# Patient Record
Sex: Female | Born: 1990 | Race: White | Hispanic: No | Marital: Single | Smoking: Current some day smoker
Health system: Southern US, Community
[De-identification: ages and names within clinical notes are randomized; demographics above are authoritative.]

## PROBLEM LIST (undated history)

## (undated) DIAGNOSIS — F419 Anxiety disorder, unspecified: Secondary | ICD-10-CM

## (undated) DIAGNOSIS — E781 Pure hyperglyceridemia: Secondary | ICD-10-CM

## (undated) DIAGNOSIS — F909 Attention-deficit hyperactivity disorder, unspecified type: Secondary | ICD-10-CM

## (undated) HISTORY — DX: Pure hyperglyceridemia: E78.1

## (undated) HISTORY — DX: Attention-deficit hyperactivity disorder, unspecified type: F90.9

## (undated) HISTORY — DX: Anxiety disorder, unspecified: F41.9

---
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
## 2010-04-16 ENCOUNTER — Emergency Department (HOSPITAL_COMMUNITY): Admission: EM | Admit: 2010-04-16 | Discharge: 2010-04-16 | Payer: Self-pay | Admitting: Emergency Medicine

## 2010-08-21 LAB — URINE MICROSCOPIC-ADD ON

## 2010-08-21 LAB — URINALYSIS, ROUTINE W REFLEX MICROSCOPIC
Glucose, UA: NEGATIVE mg/dL
Leukocytes, UA: NEGATIVE
Protein, ur: NEGATIVE mg/dL
pH: 5.5 (ref 5.0–8.0)

## 2010-08-21 LAB — GC/CHLAMYDIA PROBE AMP, GENITAL
Chlamydia, DNA Probe: NEGATIVE
GC Probe Amp, Genital: NEGATIVE

## 2010-08-21 LAB — WET PREP, GENITAL: Trich, Wet Prep: NONE SEEN

## 2010-08-21 LAB — POCT PREGNANCY, URINE: Preg Test, Ur: NEGATIVE

## 2011-05-19 ENCOUNTER — Ambulatory Visit (INDEPENDENT_AMBULATORY_CARE_PROVIDER_SITE_OTHER): Payer: BC Managed Care – PPO

## 2011-05-19 DIAGNOSIS — J029 Acute pharyngitis, unspecified: Secondary | ICD-10-CM

## 2011-07-12 ENCOUNTER — Ambulatory Visit (INDEPENDENT_AMBULATORY_CARE_PROVIDER_SITE_OTHER): Payer: BC Managed Care – PPO | Admitting: Physician Assistant

## 2011-07-12 VITALS — BP 124/78 | HR 80 | Temp 98.1°F | Resp 16 | Ht 66.5 in | Wt 204.0 lb

## 2011-07-12 DIAGNOSIS — F411 Generalized anxiety disorder: Secondary | ICD-10-CM

## 2011-07-12 DIAGNOSIS — F988 Other specified behavioral and emotional disorders with onset usually occurring in childhood and adolescence: Secondary | ICD-10-CM

## 2011-07-12 MED ORDER — ALPRAZOLAM 0.5 MG PO TABS: 0.5000 mg | ORAL_TABLET | Freq: Three times a day (TID) | ORAL | Status: AC | PRN

## 2011-07-12 MED ORDER — METHYLPHENIDATE HCL 20 MG PO TABS: 20.0000 mg | ORAL_TABLET | Freq: Two times a day (BID) | ORAL | Status: DC

## 2011-07-12 NOTE — Patient Instructions (Signed)
Continue follow-up with Lonell Face.  Take the Ritalin twice daily, though it's ok to take 1/2 tablet, and only take it once daily if you don't need a second dose.  Use the Alprazolam as needed for panic attacks.

## 2011-07-12 NOTE — Progress Notes (Signed)
  Subjective:    Patient ID: Susan [name], female    DOB: [birth year], 21 y.o.   MRN: [id number]  HPI Patient presents for reinitiation of treatment for generalized anxiety disorder and attention deficit disorder. She has previously done very well on Ritalin. She has been out of therapy and off medication for several years now. Mostly out of resistance to her parents desires space for her treatment. Both parents are psychologists. They insisted on treatment throughout her childhood and required that she see therapists that practiced their same kind of treatment-Freudian. She found treatment and very unhelpful. SSRIs increased her anxiety symptoms and exacerbated suicidal ideations. She came to [place] from [place] to attend college. She is engaged. She is taking a semester off from school with the intent to get herself "back together again."  She reports "thought loops." in the past when she was placed on Ritalin LA 40 mg she experienced "my mind suddenly cleared." She believes the LA component caused her to have mood swings but it was used because she would forget to take the second dose of the short acting product. Additionally she's used clonazepam 0.5 mg tablets. He affect was helpful but to delayed to treat the actual panic attack.  She is extremely concerned about a product that would cause weight gain as she has had a 50 pound weight gain since discontinuing Effexor last year.  Her fianc is present with her today. He also has attention difficulties.  The patient is currently in psychotherapy with Lonell Face, she is tolerating it well as he is a therapist of her choosing rather than her parents. She is not experiencing any suicidal or homicidal ideations. She is extremely scattered and is having difficulty getting much of anything down. That contributes to her anxiety symptoms and drives the cycle.   Review of Systems  Psychiatric/Behavioral: Positive for decreased concentration.  Negative for suicidal ideas, hallucinations, behavioral problems, confusion, sleep disturbance, self-injury, dysphoric mood and agitation. The patient is nervous/anxious. The patient is not hyperactive.        Objective:   Physical Exam  Psychiatric: Her speech is normal and behavior is normal. Judgment normal. Her mood appears anxious. Her affect is labile. Her affect is not angry, not blunt and not inappropriate. Thought content is not paranoid and not delusional. Cognition and memory are normal. She does not exhibit a depressed mood. She expresses no homicidal and no suicidal ideation. She expresses no suicidal plans and no homicidal plans.          Assessment & Plan:  1. Attention deficit disorder. Ritalin 20 mg 1 by mouth twice a day #60 no refills. Patient is advised that she may take one half tablet and doesn't have to take the second dose each day if it is unneeded. Reevaluate in 4 weeks. The patient intends to research the Daytrana patch in the interim.  2. Generalized anxiety disorder. Obviously exacerbated by untreated attention deficit. Anticipate improvement in overall symptoms and reduction of frequency of panic attacks with adequate treatment of hypertension. In the interim we will try alprazolam 0.5 mg 3 times a day when necessary panic. #90, no refills.

## 2011-07-25 ENCOUNTER — Telehealth: Payer: Self-pay

## 2011-07-25 NOTE — Telephone Encounter (Signed)
CHELLE   PLEASE CALL ERIC NELSON REGARDING MUTUAL PATIENT  [phone]  CELL PHONE

## 2011-07-30 ENCOUNTER — Telehealth: Payer: Self-pay

## 2011-07-30 NOTE — Telephone Encounter (Signed)
.  [facility]    ERIC NELSON REQUESTING TO SPEAK TO [name]    BEST PHONE [phone]

## 2011-07-31 MED ORDER — METHYLPHENIDATE HCL ER 20 MG PO TBCR: 20.0000 mg | EXTENDED_RELEASE_TABLET | Freq: Two times a day (BID) | ORAL | Status: DC

## 2011-07-31 NOTE — Telephone Encounter (Signed)
I called Susan [name] yesterday and left a message on his VM.  He called back on left a message on mine. I have called again today. csj

## 2011-07-31 NOTE — Telephone Encounter (Signed)
Per Chelle--she has already spoken with Mr Delton See about this pt.

## 2011-07-31 NOTE — Telephone Encounter (Signed)
Going through Ritalin quickly...effect lasting only 2-3 hours and needs full day coverage.  Had mood swings on Ritalin LA.  Would like to try Ritalin SR to reduce number of daily dosings.  Alprazolam caused disinhibition and argued with fiance.  Minerva Areola talked with her about how to use it more effectively.  We may need to change products.

## 2011-08-19 ENCOUNTER — Telehealth: Payer: Self-pay | Admitting: Physician Assistant

## 2011-08-19 NOTE — Telephone Encounter (Signed)
Heard back again from Mr. Delton See.  Lerin is doing very well on the Ritalin SR, getting 3.5-4 hours benefit from each dose.  Needs to take a third dose to cover her class schedule and work.  Please call patient and advise her she can take the Ritalin SR TID.  Let me know when she needs a refill.  I'll need to see her back in the office in about 4 weeks.

## 2011-08-19 NOTE — Telephone Encounter (Signed)
Call from patient's psychologist, Lonell Face.  Patient is getting improved benefit on SSRI and adjustment in ADD regimen.  Still needs longer effect.  He requests a call back.  I left a message for him to return my call.

## 2011-08-20 NOTE — Telephone Encounter (Signed)
Tried to contact patient, but her phone number isn't accurate.  LMOM of emergency contact to have patient call us back.

## 2011-08-22 NOTE — Telephone Encounter (Signed)
Sent unable to reach letter.

## 2011-09-16 ENCOUNTER — Telehealth: Payer: Self-pay

## 2011-09-16 NOTE — Telephone Encounter (Signed)
Mr Susan [name] would like for Chelle to contact him about pt if she can call him before 5 that would be okay if after 5p [phone]

## 2011-09-16 NOTE — Telephone Encounter (Signed)
Chelle, I'm forwarding you this message from pt's therapist..

## 2011-09-17 NOTE — Telephone Encounter (Signed)
Please call Mr. Susan [name] and [name] if you can get some additional information.  Most likely, I'll need Susan [name] to come in (she was originally to [facility] in 4 weeks after her initial visit 07/12/2011).

## 2011-09-18 NOTE — Telephone Encounter (Signed)
LMOM at [facility]'s office VM asking for [facility] w/ ?s or more details of info for Chelle.

## 2011-09-23 NOTE — Telephone Encounter (Signed)
LMOM to CB. 

## 2011-09-26 NOTE — Telephone Encounter (Signed)
LMOM on Susan [name]'s cell # to CB if he still would like to talk w/Chelle about pt, and if we don't hear back from him we will assume he no longer needs to consult w/her.

## 2011-09-28 ENCOUNTER — Ambulatory Visit (INDEPENDENT_AMBULATORY_CARE_PROVIDER_SITE_OTHER): Payer: BC Managed Care – PPO | Admitting: Physician Assistant

## 2011-09-28 VITALS — BP 130/84 | HR 93 | Temp 98.2°F | Resp 18 | Ht 66.5 in | Wt 214.4 lb

## 2011-09-28 DIAGNOSIS — F909 Attention-deficit hyperactivity disorder, unspecified type: Secondary | ICD-10-CM | POA: Insufficient documentation

## 2011-09-28 DIAGNOSIS — F988 Other specified behavioral and emotional disorders with onset usually occurring in childhood and adolescence: Secondary | ICD-10-CM

## 2011-09-28 DIAGNOSIS — F411 Generalized anxiety disorder: Secondary | ICD-10-CM

## 2011-09-28 MED ORDER — METHYLPHENIDATE HCL ER 20 MG PO TBCR: 20.0000 mg | EXTENDED_RELEASE_TABLET | Freq: Three times a day (TID) | ORAL | Status: AC

## 2011-09-28 MED ORDER — CLONAZEPAM 0.5 MG PO TABS: 0.5000 mg | ORAL_TABLET | Freq: Two times a day (BID) | ORAL | Status: AC | PRN

## 2011-09-28 NOTE — Patient Instructions (Signed)
Consider reading Fleeta Emmer works: The [place] [employer] The Omnivore's Dilemma In [employer] (this one's short, "in a nutshell")

## 2011-09-28 NOTE — Progress Notes (Signed)
  Subjective:    Patient ID: Susan [name], female    DOB: 06/03/1991, 21 y.o.   MRN: [id number]  HPI  Presents for re-evaluation of ADD and GAD.  Doing better with both, but still having breakthrough symptoms.  Ritalin SR dose lasts about 4 hours, and would like to increase the dose to TID to cover the entire day.  I have discussed this option with Susan [name], her psychologist. Exercising again-ran a 5 K. Id frustrated by 10 pound weight gain since 05/2011 despite her efforts.  However, has not noticed any change in the way her clothes fit.  Her mother, especially, has not been supportive of her body size.  The alprazolam made her angry, so she's only taken a few doses.  Has used Clonazepam before, with better results, though she can't do school work when she uses it. Would like to have it for prn use instead of the alprazolam.  Recall that she has had very bad experience with SSRI medication previously.  Review of Systems     Objective:   Physical Exam  Vital signs noted. Well-developed, well nourished WF who is awake, alert and oriented, in NAD. HEENT: [place]/AT, sclera and conjunctiva are clear.   Neck: supple, non-tender, no lymphadenopathy, thyromegaly. Heart: RRR, no murmur Lungs: CTA Skin: warm and dry without rash.       Assessment & Plan:  ADD. Increase Ritalin SR 20 mg to TID, #90, no RF. She may call for fills in May and June, and will return for re-evaluation in July. Continue Follow-up with Susan [name].  GAD with panic. D/C Alprazolam. Clonazepam 0.5 mg, 1 PO BID PRN, #20, no RF. See above.

## 2011-11-08 ENCOUNTER — Telehealth: Payer: Self-pay

## 2011-11-08 NOTE — Telephone Encounter (Signed)
Printed out a copy of message to put in patient's paper chart, because patient has not been seen in Epic and only has a Paper Chart.

## 2011-11-08 NOTE — Telephone Encounter (Signed)
Pt would like a copy of her thyriod and some blood test, she does not have a copy her father is a MD and states he wants to look at it. She states test were done back sept of 2012. Please contact when ready for pick up [phone]

## 2011-11-26 ENCOUNTER — Telehealth: Payer: Self-pay

## 2011-11-26 NOTE — Telephone Encounter (Signed)
Pt request refill of Ritalin. Please call when ready for pick up.

## 2011-11-27 ENCOUNTER — Encounter: Payer: Self-pay | Admitting: Family Medicine

## 2011-11-27 MED ORDER — METHYLPHENIDATE HCL ER 20 MG PO TBCR: 20.0000 mg | EXTENDED_RELEASE_TABLET | ORAL | Status: AC

## 2011-11-27 NOTE — Telephone Encounter (Signed)
LMOM that rx was ready at front desk for pick up

## 2011-11-27 NOTE — Telephone Encounter (Signed)
rx printed

## 2015-02-15 ENCOUNTER — Emergency Department (HOSPITAL_COMMUNITY)
Admission: EM | Admit: 2015-02-15 | Discharge: 2015-02-15 | Disposition: A | Discharge status: Home / Self Care | Payer: Self-pay | Attending: Emergency Medicine | Admitting: Emergency Medicine

## 2015-02-15 ENCOUNTER — Emergency Department (HOSPITAL_COMMUNITY): Payer: Self-pay

## 2015-02-15 ENCOUNTER — Encounter (HOSPITAL_COMMUNITY): Payer: Self-pay | Admitting: Emergency Medicine

## 2015-02-15 DIAGNOSIS — X58XXXA Exposure to other specified factors, initial encounter: Secondary | ICD-10-CM | POA: Insufficient documentation

## 2015-02-15 DIAGNOSIS — Z720 Tobacco use: Secondary | ICD-10-CM | POA: Insufficient documentation

## 2015-02-15 DIAGNOSIS — S83006A Unspecified dislocation of unspecified patella, initial encounter: Secondary | ICD-10-CM

## 2015-02-15 DIAGNOSIS — Z880 Allergy status to penicillin: Secondary | ICD-10-CM | POA: Insufficient documentation

## 2015-02-15 DIAGNOSIS — Z8639 Personal history of other endocrine, nutritional and metabolic disease: Secondary | ICD-10-CM | POA: Insufficient documentation

## 2015-02-15 DIAGNOSIS — Y9389 Activity, other specified: Secondary | ICD-10-CM | POA: Insufficient documentation

## 2015-02-15 DIAGNOSIS — M25569 Pain in unspecified knee: Secondary | ICD-10-CM

## 2015-02-15 DIAGNOSIS — Y9289 Other specified places as the place of occurrence of the external cause: Secondary | ICD-10-CM | POA: Insufficient documentation

## 2015-02-15 DIAGNOSIS — S83004A Unspecified dislocation of right patella, initial encounter: Secondary | ICD-10-CM | POA: Insufficient documentation

## 2015-02-15 DIAGNOSIS — Y998 Other external cause status: Secondary | ICD-10-CM | POA: Insufficient documentation

## 2015-02-15 DIAGNOSIS — F419 Anxiety disorder, unspecified: Secondary | ICD-10-CM | POA: Insufficient documentation

## 2015-02-15 MED ORDER — HYDROMORPHONE HCL 1 MG/ML IJ SOLN
1.0000 mg | Freq: Once | INTRAMUSCULAR | Status: AC
  Administered 2015-02-15: 1 mg via INTRAVENOUS
  Filled 2015-02-15: qty 1

## 2015-02-15 MED ORDER — NAPROXEN 500 MG PO TABS: 500.0000 mg | ORAL_TABLET | Freq: Three times a day (TID) | ORAL | Status: AC

## 2015-02-15 MED ORDER — OXYCODONE-ACETAMINOPHEN 5-325 MG PO TABS
1.0000 | ORAL_TABLET | Freq: Once | ORAL | Status: AC
  Administered 2015-02-15: 1 via ORAL
  Filled 2015-02-15: qty 1

## 2015-02-15 NOTE — Progress Notes (Signed)
Patient listed as not having insurance or a pcp.  EDCM spoke to patient at bedside.  Patient reports she has [place] one insurance under her parents.  Patient reports she provided registration with her insurance card.  EDCM explained to patient that she is still coming up as not having insurance.  Patient became tearful and reported that her parents may have not made a payment.  EDCM offered [patient support.  EDCM instructed patient to call her insurance conpany to verify if she has insurance.  If she doe have insurance, she may send her insurance information to the hospital and it will be credited.  If she does not have insurance, she will recieve a bill with payment options.  [facility] provided patient with a list of pcps who accept [place] one insurance.    EDCM also provided patient with list of pcps who accept self pay patients, including contact information for [facility], list of discount pharmacies and websites needymeds.org and GoodRX.com for medication assistance, phone number to inquire about the orange card, phone number to inquire about Mediciad, phone number to inquire about the Affordable Care Act, financial resources in the community such as local churches, salvation army, urban ministries, and dental assistance for uninsured patients.  Patient thankful for resources.  No further EDCM needs at this time.

## 2015-02-15 NOTE — ED Notes (Signed)
X-ray at bedside

## 2015-02-15 NOTE — ED Notes (Signed)
Patient keeps requesting for pain medication after given meds already.  Waiting for doctor to see patient.

## 2015-02-15 NOTE — ED Notes (Addendum)
Per EMS, patient has right knee dislocation.  No medication was given per EMS.  Patient was working out and she heard her knee pop and patella fell over.  She is unable to move right knee/leg due to pain.  Patient states she had a miscarriage 02/07/15 but she did not seek medical attention.  She thinks she was [redacted] weeks pregnant.  BP: 150/86  Pulse: 92

## 2015-02-15 NOTE — ED Notes (Signed)
Patient waiting for xray

## 2015-02-15 NOTE — ED Notes (Signed)
Patient transported to X-ray 

## 2015-02-15 NOTE — ED Provider Notes (Signed)
CSN: [id number]     Arrival date & time 02/15/15  1332 History   First MD Initiated Contact with Patient 02/15/15 1516     Chief Complaint  Patient presents with  . Knee Pain  . Dislocation     (Consider location/radiation/quality/duration/timing/severity/associated sxs/prior Treatment) Patient is a 24 y.o. female presenting with knee pain.  Knee Pain Location:  Knee Time since incident:  2 hours Injury: yes   Knee location:  R knee Pain details:    Severity:  Severe   Onset quality:  Sudden   Timing:  Constant   Progression:  Unchanged Chronicity:  New Dislocation: yes   Prior injury to area:  No Relieved by:  Nothing Worsened by:  Flexion Ineffective treatments:  None tried Associated symptoms: decreased ROM and stiffness   Associated symptoms: no back pain, no fever, no neck pain, no numbness and no tingling   Risk factors: no known bone disorder     Past Medical History  Diagnosis Date  . Anxiety   . ADD (attention deficit disorder with hyperactivity)   . Hypertriglyceridemia    Past Surgical History  Procedure Laterality Date  . Adenoidectomy  04/2002   Family History  Problem Relation Age of Onset  . Adopted: Yes  . Heart disease Maternal Grandfather    Social History  Substance Use Topics  . Smoking status: Current Some Day Smoker -- 0.50 packs/day for .5 years    Types: Cigarettes, Cigars    Last Attempt to Quit: 03/01/2008  . Smokeless tobacco: Never Used  . Alcohol Use: Yes   OB History    No data available     Review of Systems  Constitutional: Negative for fever.  HENT: Negative for sore throat.   Eyes: Negative for visual disturbance.  Respiratory: Negative for cough and shortness of breath.   Cardiovascular: Negative for chest pain.  Gastrointestinal: Negative for abdominal pain.  Genitourinary: Negative for difficulty urinating.  Musculoskeletal: Positive for stiffness. Negative for back pain and neck pain.  Skin: Negative for rash.   Neurological: Negative for syncope and headaches.      Allergies  Amoxicillin-pot clavulanate; Lithium; and Serotonin reuptake inhibitors (ssris)  Home Medications   Prior to Admission medications   Medication Sig Start Date End Date Taking? Authorizing Provider  aspirin-acetaminophen-caffeine (EXCEDRIN MIGRAINE) [phone] MG per tablet Take 1-2 tablets by mouth every 6 (six) hours as needed for headache or migraine.   Yes Historical Provider, MD  clonazePAM (KLONOPIN) 0.5 MG tablet Take 1 tablet (0.5 mg total) by mouth 2 (two) times daily as needed for anxiety. 09/28/11 02/15/15 Yes [name], PA-C  IUD's (PARAGARD INTRAUTERINE COPPER IU) by Intrauterine route.   Yes Historical Provider, MD  methylphenidate (METADATE ER) 20 MG ER tablet Take 1 tablet (20 mg total) by mouth every morning. Patient not taking: Reported on 02/15/2015 11/27/11   Porfirio Oar, PA-C  naproxen (NAPROSYN) 500 MG tablet Take 1 tablet (500 mg total) by mouth 3 (three) times daily with meals. 02/15/15   Alvira Monday, MD   BP 131/74 mmHg  Pulse 97  Temp(Src) 98.1 F (36.7 C) (Oral)  Resp 16  Ht  (1.676 m)  Wt 250 lb (113.399 kg)  BMI 40.37 kg/m2  SpO2 97%  LMP 02/08/2015 Physical Exam  Constitutional: She is oriented to person, place, and time. She appears well-developed and well-nourished. No distress.  HENT:  Head: Normocephalic and atraumatic.  Eyes: Conjunctivae and EOM are normal.  Neck: Normal range of motion.  Cardiovascular: Normal rate, regular rhythm, normal heart sounds and intact distal pulses.  Exam reveals no gallop and no friction rub.   No murmur heard. Pulmonary/Chest: Effort normal and breath sounds normal. No respiratory distress. She has no wheezes. She has no rales.  Abdominal: Soft. She exhibits no distension. There is no tenderness. There is no guarding.  Musculoskeletal: She exhibits no edema.       Right knee: She exhibits decreased range of motion, deformity and  abnormal patellar mobility. Tenderness found.  Neurological: She is alert and oriented to person, place, and time.  Skin: Skin is warm and dry. No rash noted. She is not diaphoretic. No erythema.  Nursing note and vitals reviewed.   ED Course  Reduction of dislocation Date/Time: 02/16/2015 12:28 PM Performed by: Alvira Monday Authorized by: Alvira Monday Consent: Verbal consent obtained. Risks and benefits: risks, benefits and alternatives were discussed Consent given by: patient Patient understanding: patient states understanding of the procedure being performed Patient consent: the patient's understanding of the procedure matches consent given Procedure consent: procedure consent matches procedure scheduled Relevant documents: relevant documents present and verified Test results: test results available and properly labeled Site marked: the operative site was marked Imaging studies: imaging studies available Required items: required blood products, implants, devices, and special equipment available Patient identity confirmed: verbally with patient Time out: Immediately prior to procedure a "time out" was called to verify the correct patient, procedure, equipment, support staff and site/side marked as required. Local anesthesia used: no Patient sedated: no Patient tolerance: Patient tolerated the procedure well with no immediate complications Comments: Patellar dislocation reduction   (including critical care time) Labs Review Labs Reviewed - No data to display  Imaging Review Dg Knee 1-2 Views Right  02/15/2015   CLINICAL DATA:  Status post reduction of the patella. Initial encounter.  EXAM: RIGHT KNEE - 1-2 VIEW  COMPARISON:  Plain films of the right knee earlier today.  FINDINGS: The patella now appears appropriately aligned. No fracture is identified. Joint effusion is noted.  IMPRESSION: Successful reduction of the patella.  Joint effusion is noted.   Electronically Signed    By: Drusilla Kanner M.D.   On: 02/15/2015 17:41   Dg Knee Complete 4 Views Right  02/15/2015   CLINICAL DATA:  Anterior right knee pain. The patient was doing lunges today and heard a pop. Initial encounter.  EXAM: RIGHT KNEE - COMPLETE 4+ VIEW  COMPARISON:  None.  FINDINGS: The patella appears laterally subluxed or dislocated although positioning is nonstandard. Patella baja is noted. Small joint effusion is seen. No fracture is identified.  IMPRESSION: Possible lateral subluxation or dislocation of the patella. Patella baja also noted.   Electronically Signed   By: Drusilla Kanner M.D.   On: 02/15/2015 15:35   I have personally reviewed and evaluated these images and lab results as part of my medical decision-making.   EKG Interpretation None      MDM   Final diagnoses:  Patellar dislocation   24 yo female with hx of PTSD presents with concern of sudden right knee pain and deformity which occurred suddenly while performing lunges today.  Patient with right patellar dislocation on exam.  Neurovascularly intact, without laceration.  Given dilaudid IV and reduction performed at bedside without complications.  Pt nv intact following reduction.  Post-reduction films show no sign of fracture. Placed in knee immobilizer and recommended follow up with Orthopedic surgeon.   Alvira Monday, MD 02/16/15 1230

## 2015-02-15 NOTE — ED Notes (Signed)
Bed: WU98 Expected date:  Expected time:  Means of arrival:  Comments: dislocation

## 2016-08-31 IMAGING — CR DG KNEE COMPLETE 4+V*R*
4 series · 4 of 4 positions shown · non-contrast
Comparison: None.

CLINICAL DATA: Anterior right knee pain. The patient was doing
lunges today and heard a pop. Initial encounter.

EXAM:
RIGHT KNEE - COMPLETE 4+ VIEW

[x knee lat right]
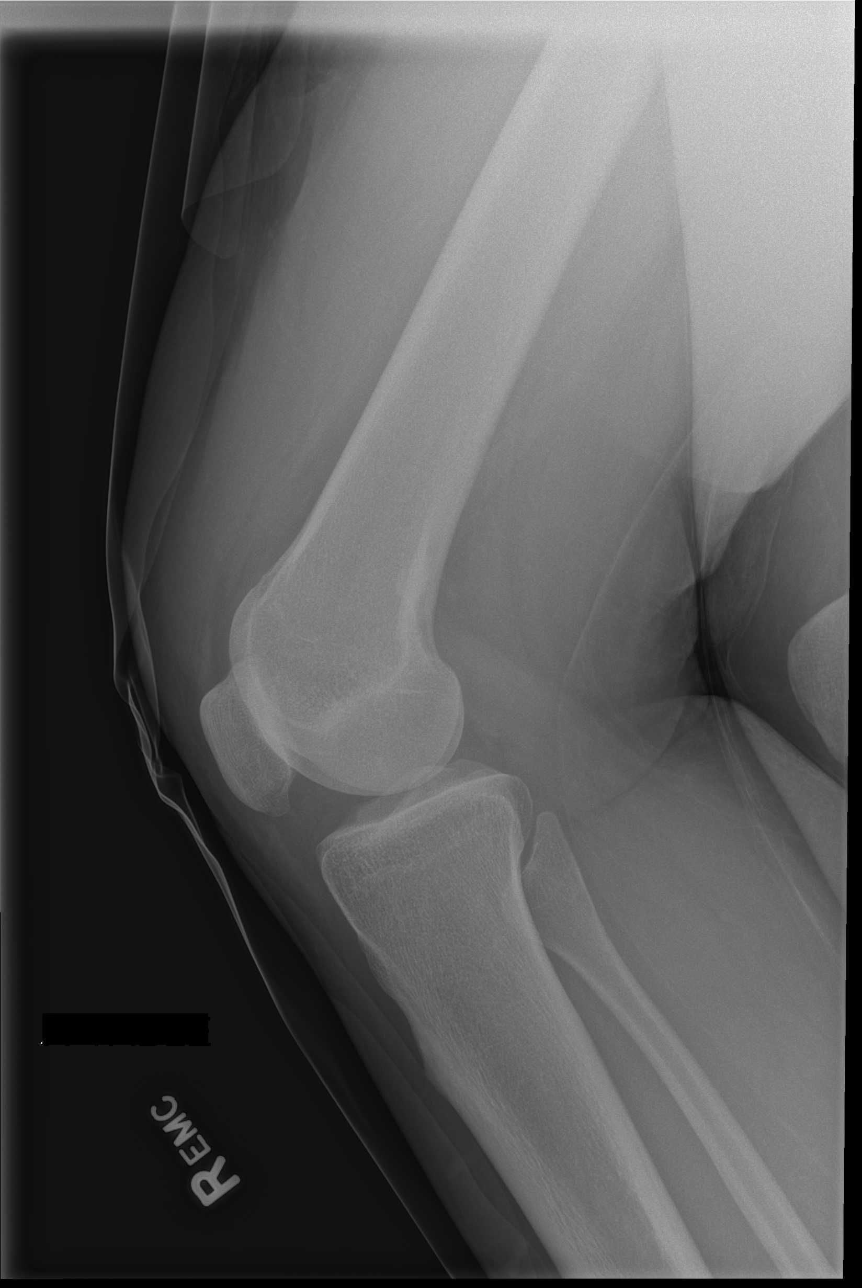

[x knee ap right]
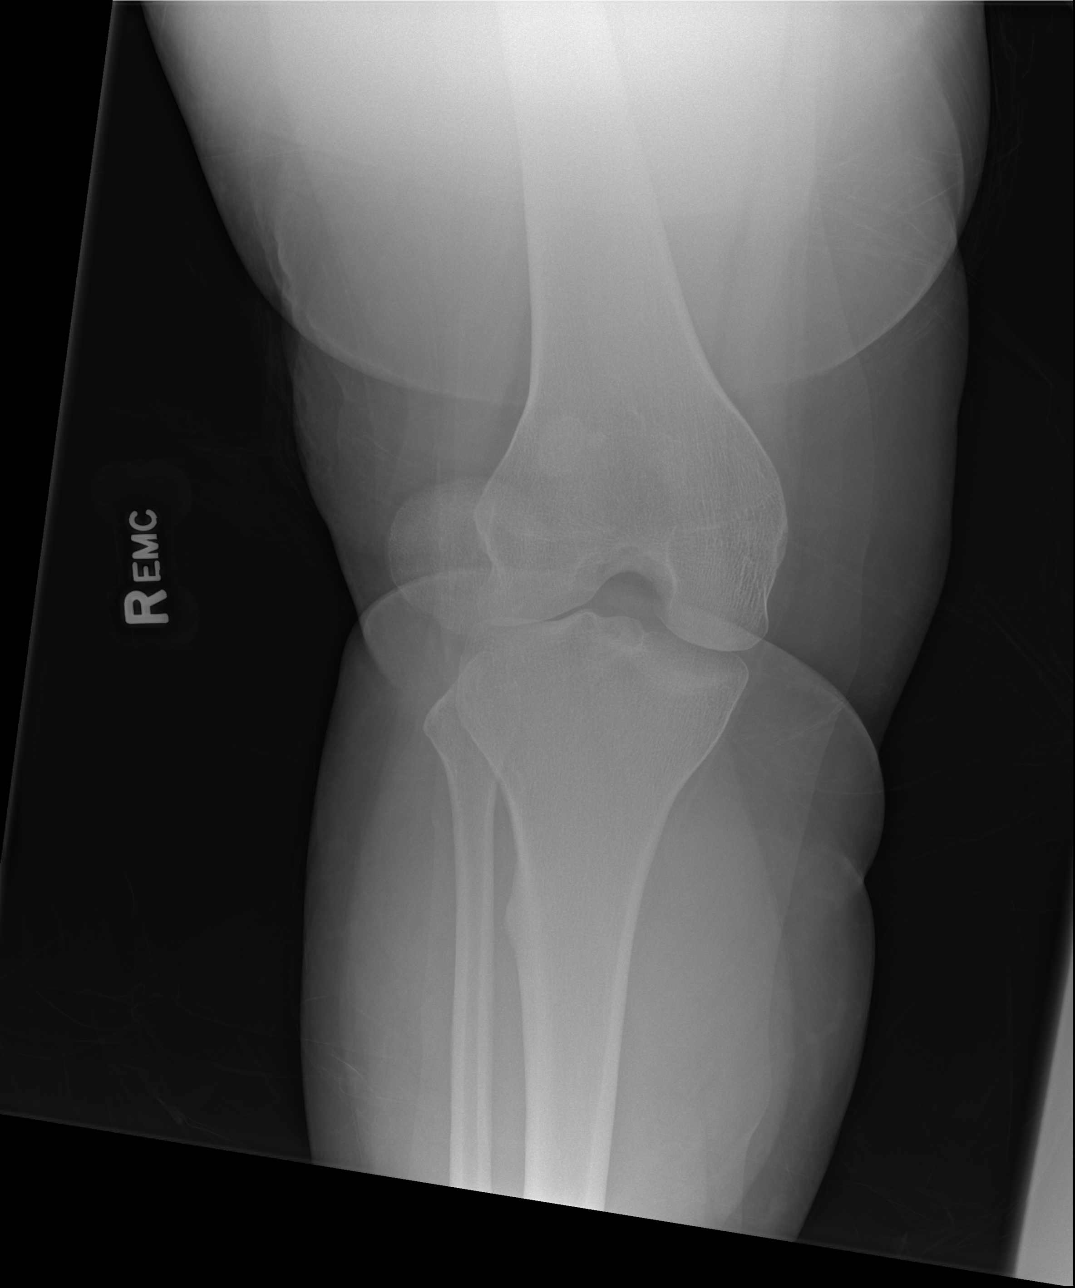

[x knee obl right (1 of 2)]
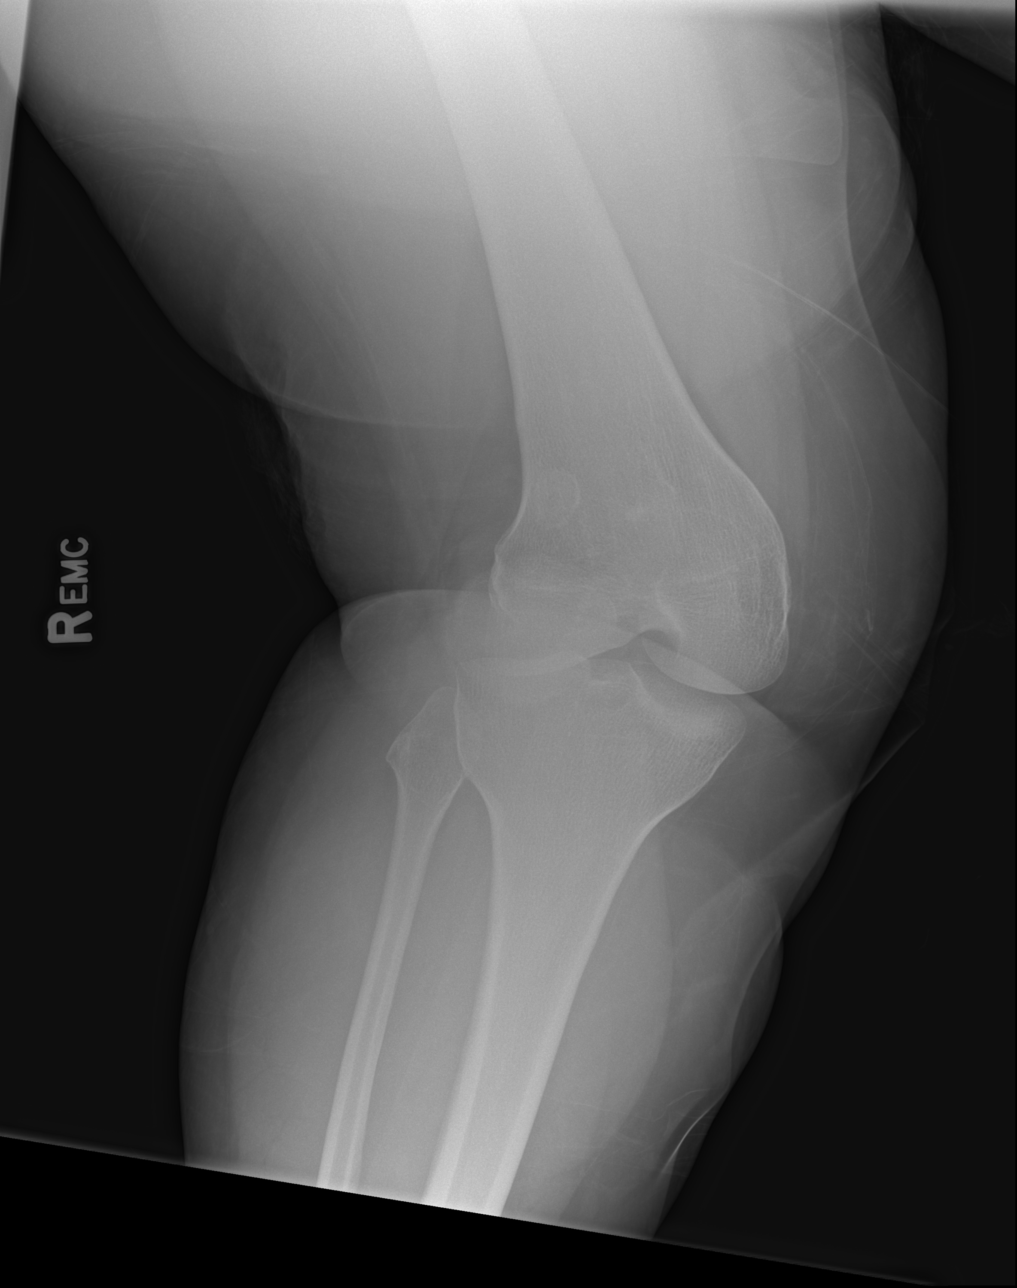

[x knee obl right (2 of 2)]
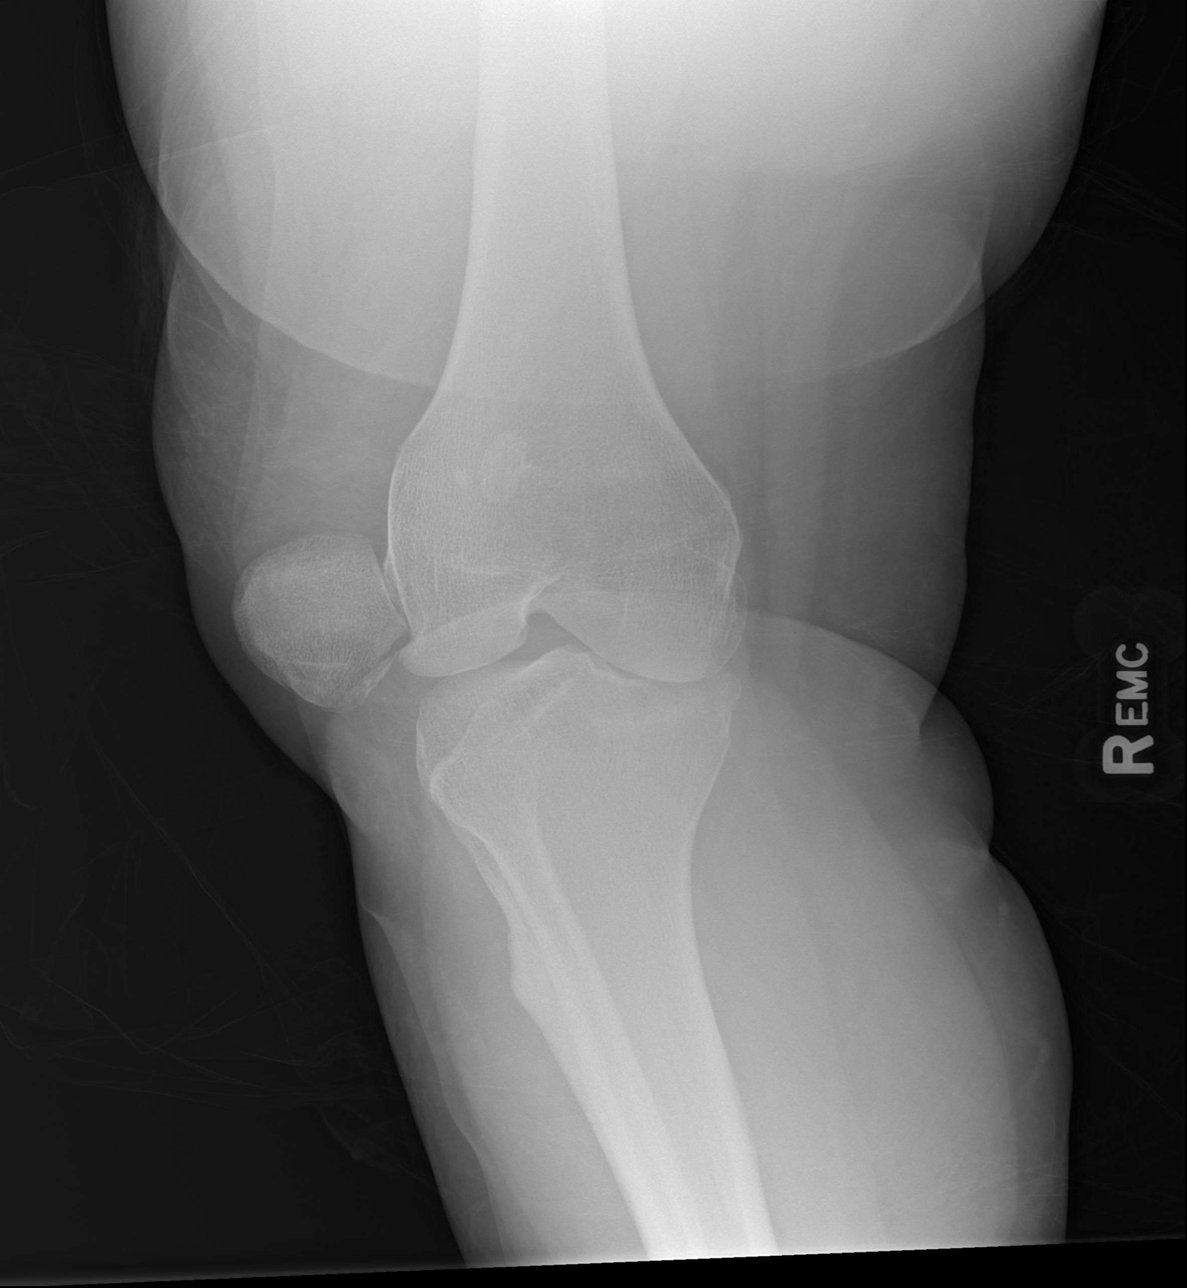

[4 of 4 positions shown; findings below may reference images not displayed]

FINDINGS: The patella appears laterally subluxed or dislocated although
positioning is nonstandard. Patella baja is noted. Small joint
effusion is seen. No fracture is identified.
IMPRESSION: Possible lateral subluxation or dislocation of the patella. Patella
baja also noted.
